# Patient Record
Sex: Female | Born: 1985 | Race: Black or African American | Hispanic: No | Marital: Single | Smoking: Current every day smoker
Health system: Southern US, Community
[De-identification: ages and names within clinical notes are randomized; demographics above are authoritative.]

---
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
## 2003-02-21 ENCOUNTER — Inpatient Hospital Stay (HOSPITAL_COMMUNITY): Admission: AD | Admit: 2003-02-21 | Discharge: 2003-02-21 | Payer: Self-pay | Admitting: Obstetrics & Gynecology

## 2003-02-23 ENCOUNTER — Inpatient Hospital Stay (HOSPITAL_COMMUNITY): Admission: AD | Admit: 2003-02-23 | Discharge: 2003-02-23 | Payer: Self-pay | Admitting: *Deleted

## 2004-06-03 ENCOUNTER — Inpatient Hospital Stay (HOSPITAL_COMMUNITY): Admission: AD | Admit: 2004-06-03 | Discharge: 2004-06-03 | Payer: Self-pay | Admitting: *Deleted

## 2004-06-12 ENCOUNTER — Other Ambulatory Visit: Admission: RE | Admit: 2004-06-12 | Discharge: 2004-06-12 | Payer: Self-pay | Admitting: Obstetrics and Gynecology

## 2004-08-04 ENCOUNTER — Inpatient Hospital Stay (HOSPITAL_COMMUNITY): Admission: AD | Admit: 2004-08-04 | Discharge: 2004-08-04 | Payer: Self-pay | Admitting: *Deleted

## 2004-11-06 ENCOUNTER — Inpatient Hospital Stay (HOSPITAL_COMMUNITY): Admission: AD | Admit: 2004-11-06 | Discharge: 2004-11-06 | Payer: Self-pay | Admitting: Obstetrics and Gynecology

## 2005-01-07 ENCOUNTER — Inpatient Hospital Stay (HOSPITAL_COMMUNITY): Admission: AD | Admit: 2005-01-07 | Discharge: 2005-01-09 | Payer: Self-pay | Admitting: Obstetrics and Gynecology

## 2005-02-17 ENCOUNTER — Other Ambulatory Visit: Admission: RE | Admit: 2005-02-17 | Discharge: 2005-02-17 | Payer: Self-pay | Admitting: Obstetrics and Gynecology

## 2006-08-17 ENCOUNTER — Emergency Department (HOSPITAL_COMMUNITY): Admission: EM | Admit: 2006-08-17 | Discharge: 2006-08-17 | Payer: Self-pay | Admitting: Emergency Medicine

## 2006-11-20 ENCOUNTER — Inpatient Hospital Stay (HOSPITAL_COMMUNITY): Admission: AD | Admit: 2006-11-20 | Discharge: 2006-11-20 | Payer: Self-pay | Admitting: Obstetrics & Gynecology

## 2006-11-21 ENCOUNTER — Emergency Department (HOSPITAL_COMMUNITY): Admission: EM | Admit: 2006-11-21 | Discharge: 2006-11-21 | Payer: Self-pay | Admitting: Emergency Medicine

## 2006-12-01 ENCOUNTER — Emergency Department (HOSPITAL_COMMUNITY): Admission: EM | Admit: 2006-12-01 | Discharge: 2006-12-01 | Payer: Self-pay | Admitting: Emergency Medicine

## 2007-06-29 ENCOUNTER — Emergency Department (HOSPITAL_COMMUNITY): Admission: EM | Admit: 2007-06-29 | Discharge: 2007-06-29 | Payer: Self-pay | Admitting: Emergency Medicine

## 2007-08-22 ENCOUNTER — Emergency Department (HOSPITAL_COMMUNITY): Admission: EM | Admit: 2007-08-22 | Discharge: 2007-08-22 | Payer: Self-pay | Admitting: Emergency Medicine

## 2007-11-02 ENCOUNTER — Emergency Department (HOSPITAL_COMMUNITY): Admission: EM | Admit: 2007-11-02 | Discharge: 2007-11-02 | Payer: Self-pay | Admitting: Emergency Medicine

## 2008-08-13 ENCOUNTER — Emergency Department (HOSPITAL_COMMUNITY): Admission: EM | Admit: 2008-08-13 | Discharge: 2008-08-13 | Payer: Self-pay | Admitting: Family Medicine

## 2010-03-25 ENCOUNTER — Inpatient Hospital Stay (INDEPENDENT_AMBULATORY_CARE_PROVIDER_SITE_OTHER)
Admission: RE | Admit: 2010-03-25 | Discharge: 2010-03-25 | Disposition: A | Discharge status: Home / Self Care | Payer: Self-pay | Source: Ambulatory Visit | Attending: Emergency Medicine | Admitting: Emergency Medicine

## 2010-03-25 DIAGNOSIS — H109 Unspecified conjunctivitis: Secondary | ICD-10-CM

## 2010-05-06 ENCOUNTER — Ambulatory Visit: Payer: Self-pay | Admitting: Obstetrics and Gynecology

## 2010-06-12 NOTE — Discharge Summary (Signed)
[facility]  Patient:    Patty Marshall, Patty Marshall                         MRN: [id number] Adm. Date:  03/23/00 Disc. Date: 03/26/00 Attending:  Bing Neighbors. Clearance Coots, M.D.                           Discharge Summary  ADMITTING DIAGNOSES:          Acute cholelithiasis.  DISCHARGE DIAGNOSES:          Acute cholelithiasis, status post laparoscopic cholecystectomy and intraoperative cholangiogram, improved, discharged home in good condition.  REASON FOR ADMISSION:         A 25 year old G0 with last menstrual period March 07, 2000 presented to triage after an episode of acute right upper quadrant pain yesterday after eating a [employer] meal.  The right upper quadrant pain followed by persistent pain and nausea and vomiting.  The patient could not keep anything down.  This is the first episode of such pain and nausea and vomiting.  PAST MEDICAL HISTORY:         See chart.  SOCIAL HISTORY:               See chart.  PHYSICAL EXAMINATION  GENERAL:                      Well-nourished, well-developed female in no acute distress.  VITAL SIGNS:                  Temperature 98.6, pulse 85, respiratory rate 21, blood pressure 135/81.  HEENT:                        Benign.  ABDOMEN:                      Soft, positive bowel sounds, tender right upper quadrant at costovertebral angle.  No masses or organomegaly appreciated.  PELVIC:                       Omitted.  LABORATORIES:                 SGOT 1306, SGPT 1461, total bilirubin 2.8, alkaline phosphatase 159.  Ultrasound revealed two clusters of gallstones within the gallbladder.  Fundus and body:  There was no gallbladder wall thickening, free fluid, or Murphys. Common bile duct was approximately 2.1 mm.  HOSPITAL COURSE:              Patient continued to have significant pain and was taken to the operating room on March 25, 2000 and underwent laparoscopic cholecystectomy and intraoperative cholangiogram  without any intraoperative complications.  Postoperative course was uncomplicated and she was discharged home on postoperative day #1 in good condition.  DISCHARGE MEDICATIONS:        1. Vicodin one to two tablets q.4h. p.r.n. for                                  pain.                               2. Motrin or Tylenol p.r.n. for pain.  DISCHARGE INSTRUCTIONS:  Routine written instructions were given for activity, diet, and wound care.  Patient is to follow-up with Dr. Lurene Shadow in the office in two weeks. DD:  08/19/00 TD:  08/20/00 Job: [id number]

## 2010-06-12 NOTE — Op Note (Signed)
[facility]  Patient:    Patty Marshall, Patty Marshall                         MRN: [id number] Proc. Date: 03/25/00 Adm. Date:  [id number] Disc. Date: [id number] Attending:  Tammi Sou                           Operative Report  PREOPERATIVE DIAGNOSIS:       Acute cholecystitis.  POSTOPERATIVE DIAGNOSIS:      Acute cholecystitis.  PROCEDURE:                    Laparoscopic cholecystectomy.                               Intraoperative cholangiogram.  SURGEON:                      Luisa Hart L. Lurene Shadow, M.D.  ASSISTANT:                    Marnee Spring. Wiliam Ke, M.D.  ANESTHESIA:                   General.  INDICATIONS:                  This patient is a 25 year old young woman presenting with right side abdominal pain following food associated with nausea and vomiting. In the hospital with elevated liver function studies and showing a bilirubin of up to 2.8 and transaminases greater than 1000 with a modestly elevated alkaline phosphatase. She was brought to the hospital and put on IVs and n.p.o. and observed over 24 hours. During that time, her bilirubin returned toward normal and transaminases came down significantly. She continued, however, to have some right upper quadrant pain and is brought to the operating room now for laparoscopic cholecystectomy.  DESCRIPTION OF PROCEDURE:     Following the induction of anesthesia, the patient was positioned supinely and the abdomen is routinely prepped and draped to be included in the sterile operative field. Open laparoscopy is created through the umbilicus with insertion of a Hasson cannula and insufflation of peritoneal cavity to 14 mmHg pressure using carbon dioxide. The camera was inserted and visual exploration of the abdomen carried out. The liver edge was sharp. The gallbladder was acutely inflamed. Anterior gastric wall and duodenum appeared to be normal. There was some tattering of the duodenum up to the  gallbladder. Under direct vision, epigastric and lateral ports are placed. The gallbladder is grasped and retracted cephalad. The gallbladder is noted to be somewhat intrahepatic. Dissection carried down near the ampulla with isolation of the cystic artery and cystic duct; the cystic artery being traced through the antrum to the gallbladder wall and the cystic duct being traced to the cystic duct-gallbladder junction. The common duct could be clearly seen and traced out for approximately _____  distal course and the hepatic duct could also be seen. The cystic artery was doubly clipped and transected. Cystic duct was clipped proximally and opened. A Reddick catheter passed into the abdomen through a 14-gauge Angiocath was then used to inject one-half strength Hypaque dye into the biliary system. The resultant cholangiogram showed free flow of contrast into the duodenum without any evidence of filling defects.  The upper hepatic radicles appeared to be normal.  The cholangiocatheter was removed and the cystic duct doubly clipped and transected and the gallbladder then dissected through from the liver bed with electrocautery. At the end of the dissection, the gut/liver bed was again thoroughly inspected for hemostasis with additional bleeding ______ with electrocautery. The right upper quadrant was then thoroughly irrigated with normal saline and the camera removed through the epigastric port. The gallbladder retrieved through the umbilical port without difficulty. The sponge, instrument, and sharp counts were verified and pneumoperitoneum deflated after the trocars were removed under direct vision. Wounds were then closed in layers as follows: Umbilical wound in two layers with 0 Dexon and 4-0 Dexon; epigastric and lateral flank wounds closed with 4-0 Dexon sutures. All wounds reinforced with Steri-Strips and sterile dressing is applied. Anesthetic reversed and patient removed from the  operating room to the recovery room in stable condition having tolerated the procedure well. DD:  03/25/00 TD:  03/27/00 Job: [id number]

## 2010-10-09 ENCOUNTER — Emergency Department (HOSPITAL_COMMUNITY)
Admission: EM | Admit: 2010-10-09 | Discharge: 2010-10-09 | Disposition: A | Discharge status: Home / Self Care | Payer: Self-pay | Attending: Emergency Medicine | Admitting: Emergency Medicine

## 2010-10-09 ENCOUNTER — Emergency Department (HOSPITAL_COMMUNITY): Payer: Self-pay

## 2010-10-09 DIAGNOSIS — J069 Acute upper respiratory infection, unspecified: Secondary | ICD-10-CM | POA: Insufficient documentation

## 2010-10-09 DIAGNOSIS — R062 Wheezing: Secondary | ICD-10-CM | POA: Insufficient documentation

## 2010-10-09 DIAGNOSIS — R1110 Vomiting, unspecified: Secondary | ICD-10-CM | POA: Insufficient documentation

## 2010-10-09 DIAGNOSIS — R059 Cough, unspecified: Secondary | ICD-10-CM | POA: Insufficient documentation

## 2010-10-09 DIAGNOSIS — R05 Cough: Secondary | ICD-10-CM | POA: Insufficient documentation

## 2010-10-09 DIAGNOSIS — R0609 Other forms of dyspnea: Secondary | ICD-10-CM | POA: Insufficient documentation

## 2010-10-09 DIAGNOSIS — R630 Anorexia: Secondary | ICD-10-CM | POA: Insufficient documentation

## 2010-10-09 DIAGNOSIS — J3489 Other specified disorders of nose and nasal sinuses: Secondary | ICD-10-CM | POA: Insufficient documentation

## 2010-10-09 DIAGNOSIS — F17200 Nicotine dependence, unspecified, uncomplicated: Secondary | ICD-10-CM | POA: Insufficient documentation

## 2010-10-09 DIAGNOSIS — R070 Pain in throat: Secondary | ICD-10-CM | POA: Insufficient documentation

## 2010-10-09 DIAGNOSIS — R0989 Other specified symptoms and signs involving the circulatory and respiratory systems: Secondary | ICD-10-CM | POA: Insufficient documentation

## 2010-10-09 DIAGNOSIS — R0602 Shortness of breath: Secondary | ICD-10-CM | POA: Insufficient documentation

## 2010-10-09 DIAGNOSIS — R079 Chest pain, unspecified: Secondary | ICD-10-CM | POA: Insufficient documentation

## 2010-11-04 LAB — URINALYSIS, ROUTINE W REFLEX MICROSCOPIC
Bilirubin Urine: NEGATIVE
Glucose, UA: NEGATIVE
Hgb urine dipstick: NEGATIVE
Ketones, ur: 15 — AB
Nitrite: NEGATIVE
Nitrite: POSITIVE — AB
Urobilinogen, UA: 0.2
Urobilinogen, UA: 4 — ABNORMAL HIGH

## 2010-11-04 LAB — DIFFERENTIAL
Basophils Absolute: 0
Basophils Relative: 0
Eosinophils Absolute: 0
Eosinophils Relative: 1
Lymphocytes Relative: 15
Monocytes Relative: 4

## 2010-11-04 LAB — POCT PREGNANCY, URINE
Operator id: 117411
Preg Test, Ur: NEGATIVE

## 2010-11-04 LAB — URINE CULTURE: Colony Count: 3000

## 2010-11-04 LAB — URINE MICROSCOPIC-ADD ON

## 2010-11-04 LAB — CBC
HCT: 39.1
MCHC: 35.8
MCV: 88.8
RBC: 4.41

## 2010-11-09 LAB — URINALYSIS, ROUTINE W REFLEX MICROSCOPIC
Ketones, ur: 15 — AB
Specific Gravity, Urine: 1.03
Urobilinogen, UA: 1
pH: 6

## 2010-11-09 LAB — GC/CHLAMYDIA PROBE AMP, GENITAL
Chlamydia, DNA Probe: POSITIVE — AB
GC Probe Amp, Genital: NEGATIVE

## 2010-11-09 LAB — WET PREP, GENITAL
Trich, Wet Prep: NONE SEEN
Yeast Wet Prep HPF POC: NONE SEEN

## 2010-11-09 LAB — URINE MICROSCOPIC-ADD ON

## 2010-11-09 LAB — PREGNANCY, URINE: Preg Test, Ur: NEGATIVE

## 2010-11-10 ENCOUNTER — Inpatient Hospital Stay (INDEPENDENT_AMBULATORY_CARE_PROVIDER_SITE_OTHER)
Admission: RE | Admit: 2010-11-10 | Discharge: 2010-11-10 | Disposition: A | Discharge status: Home / Self Care | Payer: Self-pay | Source: Ambulatory Visit | Attending: Family Medicine | Admitting: Family Medicine

## 2010-11-10 DIAGNOSIS — L600 Ingrowing nail: Secondary | ICD-10-CM

## 2016-10-15 ENCOUNTER — Encounter (HOSPITAL_COMMUNITY): Payer: Self-pay | Admitting: Emergency Medicine

## 2016-10-15 ENCOUNTER — Emergency Department (HOSPITAL_COMMUNITY)
Admission: EM | Admit: 2016-10-15 | Discharge: 2016-10-15 | Disposition: A | Discharge status: Home / Self Care | Payer: Medicaid - Out of State | Attending: Emergency Medicine | Admitting: Emergency Medicine

## 2016-10-15 DIAGNOSIS — R531 Weakness: Secondary | ICD-10-CM | POA: Diagnosis present

## 2016-10-15 DIAGNOSIS — E876 Hypokalemia: Secondary | ICD-10-CM | POA: Insufficient documentation

## 2016-10-15 DIAGNOSIS — F17200 Nicotine dependence, unspecified, uncomplicated: Secondary | ICD-10-CM | POA: Insufficient documentation

## 2016-10-15 LAB — COMPREHENSIVE METABOLIC PANEL
ALT: 27 U/L (ref 14–54)
AST: 32 U/L (ref 15–41)
Albumin: 3.9 g/dL (ref 3.5–5.0)
Alkaline Phosphatase: 79 U/L (ref 38–126)
Anion gap: 9 (ref 5–15)
BILIRUBIN TOTAL: 0.3 mg/dL (ref 0.3–1.2)
BUN: 15 mg/dL (ref 6–20)
CHLORIDE: 104 mmol/L (ref 101–111)
CO2: 24 mmol/L (ref 22–32)
CREATININE: 0.95 mg/dL (ref 0.44–1.00)
Calcium: 9.6 mg/dL (ref 8.9–10.3)
GFR calc Af Amer: >60 mL/min (ref >60)
Glucose, Bld: 104 mg/dL — ABNORMAL HIGH (ref 65–99)
Potassium: 3.1 mmol/L — ABNORMAL LOW (ref 3.5–5.1)
Sodium: 137 mmol/L (ref 135–145)
TOTAL PROTEIN: 7.1 g/dL (ref 6.5–8.1)

## 2016-10-15 LAB — POC URINE PREG, ED: PREG TEST UR: NEGATIVE

## 2016-10-15 LAB — CBC WITH DIFFERENTIAL/PLATELET
Basophils Absolute: 0 10*3/uL (ref 0.0–0.1)
Basophils Relative: 0 %
EOS PCT: 1 %
Eosinophils Absolute: 0.1 10*3/uL (ref 0.0–0.7)
HCT: 39.5 % (ref 36.0–46.0)
Hemoglobin: 13.5 g/dL (ref 12.0–15.0)
LYMPHS ABS: 4.7 10*3/uL — AB (ref 0.7–4.0)
Lymphocytes Relative: 58 %
MCH: 31.1 pg (ref 26.0–34.0)
MCHC: 34.2 g/dL (ref 30.0–36.0)
MCV: 91 fL (ref 78.0–100.0)
MONOS PCT: 6 %
Monocytes Absolute: 0.5 10*3/uL (ref 0.1–1.0)
NEUTROS ABS: 2.8 10*3/uL (ref 1.7–7.7)
NEUTROS PCT: 35 %
PLATELETS: 398 10*3/uL (ref 150–400)
RBC: 4.34 MIL/uL (ref 3.87–5.11)
RDW: 12.8 % (ref 11.5–15.5)
WBC: 8.1 10*3/uL (ref 4.0–10.5)

## 2016-10-15 LAB — URINALYSIS, ROUTINE W REFLEX MICROSCOPIC
Glucose, UA: NEGATIVE mg/dL
Hgb urine dipstick: NEGATIVE
KETONES UR: 5 mg/dL — AB
Leukocytes, UA: NEGATIVE
Nitrite: NEGATIVE
Protein, ur: 100 mg/dL — AB
SPECIFIC GRAVITY, URINE: 1.033 — AB (ref 1.005–1.030)
pH: 5 (ref 5.0–8.0)

## 2016-10-15 LAB — I-STAT TROPONIN, ED: TROPONIN I, POC: 0 ng/mL (ref 0.00–0.08)

## 2016-10-15 MED ORDER — POTASSIUM CHLORIDE CRYS ER 20 MEQ PO TBCR
60.0000 meq | EXTENDED_RELEASE_TABLET | Freq: Once | ORAL | Status: AC
  Administered 2016-10-15: 60 meq via ORAL
  Filled 2016-10-15: qty 3

## 2016-10-15 NOTE — ED Triage Notes (Signed)
Pt c/o numbness to bilateral hands and generalized weakness starting 20 minutes PTA. Dr. Madilyn Hook aware, new orders placed.

## 2016-10-15 NOTE — ED Notes (Signed)
ED Provider at bedside. 

## 2016-10-15 NOTE — ED Provider Notes (Signed)
[facility]-EMERGENCY DEPT Provider Note   CSN: [id number] Arrival date & time: 10/15/16  1745     History   Chief Complaint Chief Complaint  Patient presents with  . Numbness    HPI Patty Marshall is a 31 y.o. female.  31 y/o female with a hx of HTN presents to the ED for generalized weakness. Patient states that she felt generally weak at approximately 1700 today. This was associated with palpitations characterized as "heart fluttering". She also felt paresthesias in the tips of her bilateral fingers and toes. Patient further notes some shortness of breath stating, "it felt as though something was preventing me from breathing". She denies increased stress or lack of sleep. No recent medication changes. She has been compliant with her daily antihypertensives. No recent fevers or sick contacts. Patient denies vomiting, chest pain, recent surgeries or hospitalizations, leg swelling. She denies a history of similar symptoms as well as a history of anxiety. Symptoms spontaneously improved approximately one hour after onset.   The history is provided by the patient. No language interpreter was used.    History reviewed. No pertinent past medical history.  There are no active problems to display for this patient.   History reviewed. No pertinent surgical history.  OB History    No data available       Home Medications    Prior to Admission medications   Not on File    Family History No family history on file.  Social History Social History  Substance Use Topics  . Smoking status: Current Every Day Smoker  . Smokeless tobacco: Never Used  . Alcohol use Yes     Allergies   Patient has no known allergies.   Review of Systems Review of Systems Ten systems reviewed and are negative for acute change, except as noted in the HPI.    Physical Exam Updated Vital Signs BP 107/70   Pulse 84   Temp 98.4 F (36.9 C) (Oral)   Resp 15   Ht  (1.6 m)   Wt 96.2 kg (212  lb)   LMP 10/09/2016   SpO2 100%   BMI 37.55 kg/m   Physical Exam  Constitutional: She is oriented to person, place, and time. She appears well-developed and well-nourished. No distress.  Nontoxic appearing and in NAD  HENT:  Head: Normocephalic and atraumatic.  Eyes: Conjunctivae and EOM are normal. No scleral icterus.  Neck: Normal range of motion.  Cardiovascular: Normal rate, regular rhythm and intact distal pulses.   Pulmonary/Chest: Effort normal. No respiratory distress. She has no wheezes. She has no rales.  Respirations even and unlabored  Musculoskeletal: Normal range of motion.  Neurological: She is alert and oriented to person, place, and time. She exhibits normal muscle tone. Coordination normal.  GCS 15. Patient moving all extremities.  Skin: Skin is warm and dry. No rash noted. She is not diaphoretic. No erythema. No pallor.  Psychiatric: She has a normal mood and affect. Her behavior is normal.  Nursing note and vitals reviewed.    ED Treatments / Results  Labs (all labs ordered are listed, but only abnormal results are displayed) Labs Reviewed  COMPREHENSIVE METABOLIC PANEL - Abnormal; Notable for the following:       Result Value   Potassium 3.1 (*)    Glucose, Bld 104 (*)    All other components within normal limits  CBC WITH DIFFERENTIAL/PLATELET - Abnormal; Notable for the following:    Lymphs Abs 4.7 (*)  All other components within normal limits  URINALYSIS, ROUTINE W REFLEX MICROSCOPIC - Abnormal; Notable for the following:    Color, Urine AMBER (*)    APPearance HAZY (*)    Specific Gravity, Urine 1.033 (*)    Bilirubin Urine SMALL (*)    Ketones, ur 5 (*)    Protein, ur 100 (*)    Bacteria, UA RARE (*)    Squamous Epithelial / LPF 0-5 (*)    All other components within normal limits  POC URINE PREG, ED  I-STAT TROPONIN, ED    EKG  EKG Interpretation None       Radiology No results found.  Procedures Procedures (including  critical care time)  Medications Ordered in ED Medications  potassium chloride SA (K-DUR,KLOR-CON) CR tablet 60 mEq (60 mEq Oral Given 10/15/16 2241)     Initial Impression / Assessment and Plan / ED Course  I have reviewed the triage vital signs and the nursing notes.  Pertinent labs & imaging results that were available during my care of the patient were reviewed by me and considered in my medical decision making (see chart for details).     31 year old female presents to the emergency department for a sensation of generalized weakness with associated palpitations and shortness of breath. This is followed by onset of paresthesias in the tips of her bilateral fingers and toes. Symptoms spontaneously resolved within 1 hour of onset. She denies any symptoms currently. They sound more consistent with a panic attack, though patient denies a history of anxiety. Arrhythmia also considered; however, patient with a reassuring EKG and normal troponin. Doubt PE and patient is PERC negative. Remainder of workup is also reassuring.  Low suspicion for emergent cause of symptoms. Plan to refer to primary care for additional outpatient follow-up. Return precautions discussed and provided. Patient discharged in stable condition with no unaddressed concerns.   Final Clinical Impressions(s) / ED Diagnoses   Final diagnoses:  Hypokalemia    New Prescriptions There are no discharge medications for this patient.    Antony Madura, PA-C 10/15/16 Arletha Grippe    Shaune Pollack, MD 10/16/16 [phone]

## 2016-12-04 ENCOUNTER — Emergency Department (HOSPITAL_COMMUNITY): Payer: Self-pay

## 2016-12-04 ENCOUNTER — Emergency Department (HOSPITAL_COMMUNITY)
Admission: EM | Admit: 2016-12-04 | Discharge: 2016-12-04 | Disposition: A | Discharge status: Home / Self Care | Payer: Self-pay | Attending: Emergency Medicine | Admitting: Emergency Medicine

## 2016-12-04 ENCOUNTER — Encounter (HOSPITAL_COMMUNITY): Payer: Self-pay | Admitting: Emergency Medicine

## 2016-12-04 ENCOUNTER — Other Ambulatory Visit: Payer: Self-pay

## 2016-12-04 DIAGNOSIS — F17200 Nicotine dependence, unspecified, uncomplicated: Secondary | ICD-10-CM | POA: Insufficient documentation

## 2016-12-04 DIAGNOSIS — R079 Chest pain, unspecified: Secondary | ICD-10-CM | POA: Insufficient documentation

## 2016-12-04 DIAGNOSIS — Z79899 Other long term (current) drug therapy: Secondary | ICD-10-CM | POA: Insufficient documentation

## 2016-12-04 LAB — BASIC METABOLIC PANEL
Anion gap: 8 (ref 5–15)
BUN: 11 mg/dL (ref 6–20)
CO2: 24 mmol/L (ref 22–32)
CREATININE: 0.81 mg/dL (ref 0.44–1.00)
Calcium: 9.5 mg/dL (ref 8.9–10.3)
Chloride: 104 mmol/L (ref 101–111)
GFR calc Af Amer: >60 mL/min (ref >60)
GFR calc non Af Amer: >60 mL/min (ref >60)
Glucose, Bld: 84 mg/dL (ref 65–99)
Potassium: 3.4 mmol/L — ABNORMAL LOW (ref 3.5–5.1)
Sodium: 136 mmol/L (ref 135–145)

## 2016-12-04 LAB — I-STAT BETA HCG BLOOD, ED (MC, WL, AP ONLY): I-stat hCG, quantitative: <5 m[IU]/mL (ref <5)

## 2016-12-04 LAB — CBC
HCT: 40.3 % (ref 36.0–46.0)
Hemoglobin: 13.5 g/dL (ref 12.0–15.0)
MCH: 30.6 pg (ref 26.0–34.0)
MCHC: 33.5 g/dL (ref 30.0–36.0)
MCV: 91.4 fL (ref 78.0–100.0)
PLATELETS: 358 10*3/uL (ref 150–400)
RBC: 4.41 MIL/uL (ref 3.87–5.11)
RDW: 13.4 % (ref 11.5–15.5)
WBC: 5.9 10*3/uL (ref 4.0–10.5)

## 2016-12-04 LAB — I-STAT TROPONIN, ED
Troponin i, poc: 0 ng/mL (ref 0.00–0.08)
Troponin i, poc: 0 ng/mL (ref 0.00–0.08)

## 2016-12-04 MED ORDER — ACETAMINOPHEN 500 MG PO TABS
1000.0000 mg | ORAL_TABLET | Freq: Once | ORAL | Status: AC
  Administered 2016-12-04: 1000 mg via ORAL
  Filled 2016-12-04: qty 2

## 2016-12-04 MED ORDER — PANTOPRAZOLE SODIUM 40 MG IV SOLR
40.0000 mg | Freq: Once | INTRAVENOUS | Status: AC
  Administered 2016-12-04: 40 mg via INTRAVENOUS
  Filled 2016-12-04: qty 40

## 2016-12-04 MED ORDER — GI COCKTAIL ~~LOC~~
30.0000 mL | Freq: Once | ORAL | Status: AC
  Administered 2016-12-04: 30 mL via ORAL
  Filled 2016-12-04: qty 30

## 2016-12-04 MED ORDER — FAMOTIDINE 20 MG PO TABS: 20.0000 mg | ORAL_TABLET | Freq: Two times a day (BID) | ORAL | 0 refills | Status: AC

## 2016-12-04 NOTE — ED Triage Notes (Signed)
Pt states new onset centralized chest pain radiating into neck and back. Worse with deep breaths. Pt states hx of HTN. Denies recent travel/not on birth control. Pt states she has not taken her BP meds yet today and is out of one of them.

## 2016-12-04 NOTE — Discharge Instructions (Signed)
Take Pepcid twice daily as prescribed.  Please follow-up and establish care with a primary care provider by calling one of the numbers below.  Try to eat at least something, especially if you will be drinking acidic drinks like coffee.  Please return the emergency department if you develop any new or worsening symptoms.

## 2016-12-04 NOTE — ED Provider Notes (Signed)
[facility]   CSN: [id number] Arrival date & time: 12/04/16  1130     History   Chief Complaint Chief Complaint  Patient presents with  . Chest Pain    HPI Patty Marshall is a 31 y.o. female with history of hypertension which is controlled with medication who presents with a 3-hour history of central chest pain.  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  These pains resolved and she began having a discomfort and "irritation"in her central chest.  Nothing makes it better or worse.  She reports she feels like she has to take a deep breath to make it better.  She is not short of breath.  She denies pleuritic symptoms.  She reports she did not eat anything this morning.  She did have some coffee and apple juice.  She denies any abdominal pain, nausea, vomiting, urinary symptoms.  She denies occasion prior to arrival.  She denies any recent long trips, surgeries, cancer, new leg pain or swelling, exogenous estrogen use, or history of blood clots.  Patient admits to smoking cigarettes and marijuana, however did not smoke any marijuana today.  She denies cocaine use.  HPI  History reviewed. No pertinent past medical history.  There are no active problems to display for this patient.   No past surgical history on file.  OB History    No data available       Home Medications    Prior to Admission medications   Medication Sig Start Date End Date Taking? Authorizing Provider  amLODipine (NORVASC) 5 MG tablet Take 5 mg daily by mouth.   Yes [provider]  enalapril-hydrochlorothiazide (VASERETIC) 10-25 MG tablet Take 1 tablet daily by mouth.   Yes [provider]  famotidine (PEPCID) 20 MG tablet Take 1 tablet (20 mg total) 2 (two) times daily by mouth. 12/04/16   Leontyne Manville, Waylan BogaAlexandra M, PA-C    Family History No family history on  file.  Social History Social History   Tobacco Use  . Smoking status: Current Every Day Smoker  . Smokeless tobacco: Never Used  Substance Use Topics  . Alcohol use: Yes  . Drug use: Yes    Types: Marijuana     Allergies   Patient has no known allergies.   Review of Systems Review of Systems  Constitutional: Negative for chills and fever.  HENT: Negative for facial swelling and sore throat.   Respiratory: Negative for cough and shortness of breath.   Cardiovascular: Positive for chest pain.  Gastrointestinal: Negative for abdominal pain, nausea and vomiting.  Genitourinary: Negative for dysuria.  Musculoskeletal: Positive for back pain and neck pain.  Skin: Negative for rash and wound.  Neurological: Negative for headaches.  Psychiatric/Behavioral: The patient is not nervous/anxious.      Physical Exam Updated Vital Signs BP 120/87   Pulse 61   Temp 98.2 F (36.8 C) (Oral)   Resp 19   Ht 5\' 4"  (1.626 m)   Wt 89.8 kg (198 lb)   LMP 11/24/2016   SpO2 100%   BMI 33.99 kg/m   Physical Exam  Constitutional: She appears well-developed and well-nourished. No distress.  HENT:  Head: Normocephalic and atraumatic.  Mouth/Throat: Oropharynx is clear and moist. No oropharyngeal exudate.  Eyes: Conjunctivae are normal. Pupils are equal, round, and reactive to light. Right eye exhibits no discharge. Left eye  exhibits no discharge. No scleral icterus.  Neck: Normal range of motion. Neck supple. No thyromegaly present.  Cardiovascular: Normal rate, regular rhythm, normal heart sounds and intact distal pulses. Exam reveals no gallop and no friction rub.  No murmur heard. Pulmonary/Chest: Effort normal and breath sounds normal. No stridor. No respiratory distress. She has no wheezes. She has no rales. She exhibits no tenderness.  Abdominal: Soft. Bowel sounds are normal. She exhibits no distension. There is no tenderness. There is no rebound and no guarding.  Musculoskeletal:  She exhibits no edema.  No calf pain or tenderness  Lymphadenopathy:    She has no cervical adenopathy.  Neurological: She is alert. Coordination normal.  Skin: Skin is warm and dry. No rash noted. She is not diaphoretic. No pallor.  Psychiatric: She has a normal mood and affect.  Nursing note and vitals reviewed.    ED Treatments / Results  Labs (all labs ordered are listed, but only abnormal results are displayed) Labs Reviewed  BASIC METABOLIC PANEL - Abnormal; Notable for the following components:      Result Value   Potassium 3.4 (*)    All other components within normal limits  CBC  I-STAT TROPONIN, ED  I-STAT BETA HCG BLOOD, ED ([facility], [facility], [facility] ONLY)  I-STAT TROPONIN, ED    EKG  EKG Interpretation  Date/Time:  Saturday December 04 2016 11:35:48 EST Ventricular Rate:  68 PR Interval:  130 QRS Duration: 72 QT Interval:  390 QTC Calculation: 414 R Axis:   57 Text Interpretation:  Normal sinus rhythm Normal ECG No significant change since last tracing Confirmed by Gwyneth SproutPlunkett, Whitney ([id number]) on 12/04/2016 12:04:05 PM Also confirmed by Gwyneth SproutPlunkett, Whitney ([id number]), editor Madalyn RobEverhart, Marilyn [phone]([id number])  on 12/04/2016 12:55:21 PM       Radiology Dg Chest 2 View  Result Date: 12/04/2016 CLINICAL DATA:  Shoulder heaviness EXAM: CHEST  2 VIEW COMPARISON:  10/09/2010 FINDINGS: Normal heart size. Lungs clear. No pneumothorax. No pleural effusion. IMPRESSION: No active cardiopulmonary disease. Electronically Signed   By: Jolaine ClickArthur  Hoss M.D.   On: 12/04/2016 12:16    Procedures Procedures (including critical care time)  Medications Ordered in ED Medications  pantoprazole (PROTONIX) injection 40 mg (40 mg Intravenous Given 12/04/16 1241)  gi cocktail (Maalox,Lidocaine,Donnatal) (30 mLs Oral Given 12/04/16 1241)  acetaminophen (TYLENOL) tablet 1,000 mg (1,000 mg Oral Given 12/04/16 1342)     Initial Impression / Assessment and Plan / ED Course  I have reviewed the triage vital signs  and the nursing notes.  Pertinent labs & imaging results that were available during my care of the patient were reviewed by me and considered in my medical decision making (see chart for details).  Clinical Course as of Dec 04 1733  Sat Dec 04, 2016  1319 On reevaluation after Protonix and GI cocktail, patient states that she feels a little better, however her pain is less and irritation and more of a pain, however it is becoming intermittent and less constant like it was.  [AL]    Clinical Course User Index [AL] Emi HolesLaw, Oleda Borski M, PA-C    Patient with chest pain, most likely of gastrointestinal cause.  Patient improved after IV Protonix, GI cocktail, and Tylenol.  It also improved after eating.  Labs are unremarkable.  Delta troponin is negative. Doubt ACS, heart score 1 or 2. PERC negative. EKG shows NSR.  Chest x-ray is negative.  Will discharge home with Pepcid.  I encouraged patient to have at least a small  breakfast each morning to help with her symptoms.  Patient advised to follow-up and establish care with a primary care provider.  She was given resources.  Strict return precautions discussed.  Patient understands and agrees with plan.  Patient vitals stable throughout ED course and discharged in satisfactory condition. I discussed patient case with Dr. Anitra Lauth who guided the patient's management and agrees with plan.   Final Clinical Impressions(s) / ED Diagnoses   Final diagnoses:  Nonspecific chest pain    ED Discharge Orders        Ordered    famotidine (PEPCID) 20 MG tablet  2 times daily     12/04/16 [address] [place], [place] 12/04/16 1735    Gwyneth Sprout, MD 12/05/16 1149

## 2019-06-07 IMAGING — CR DG CHEST 2V
2 series · 2 of 2 positions shown · non-contrast
Comparison: 10/09/2010

CLINICAL DATA: Shoulder heaviness

EXAM:
CHEST  2 VIEW

[chest pa]
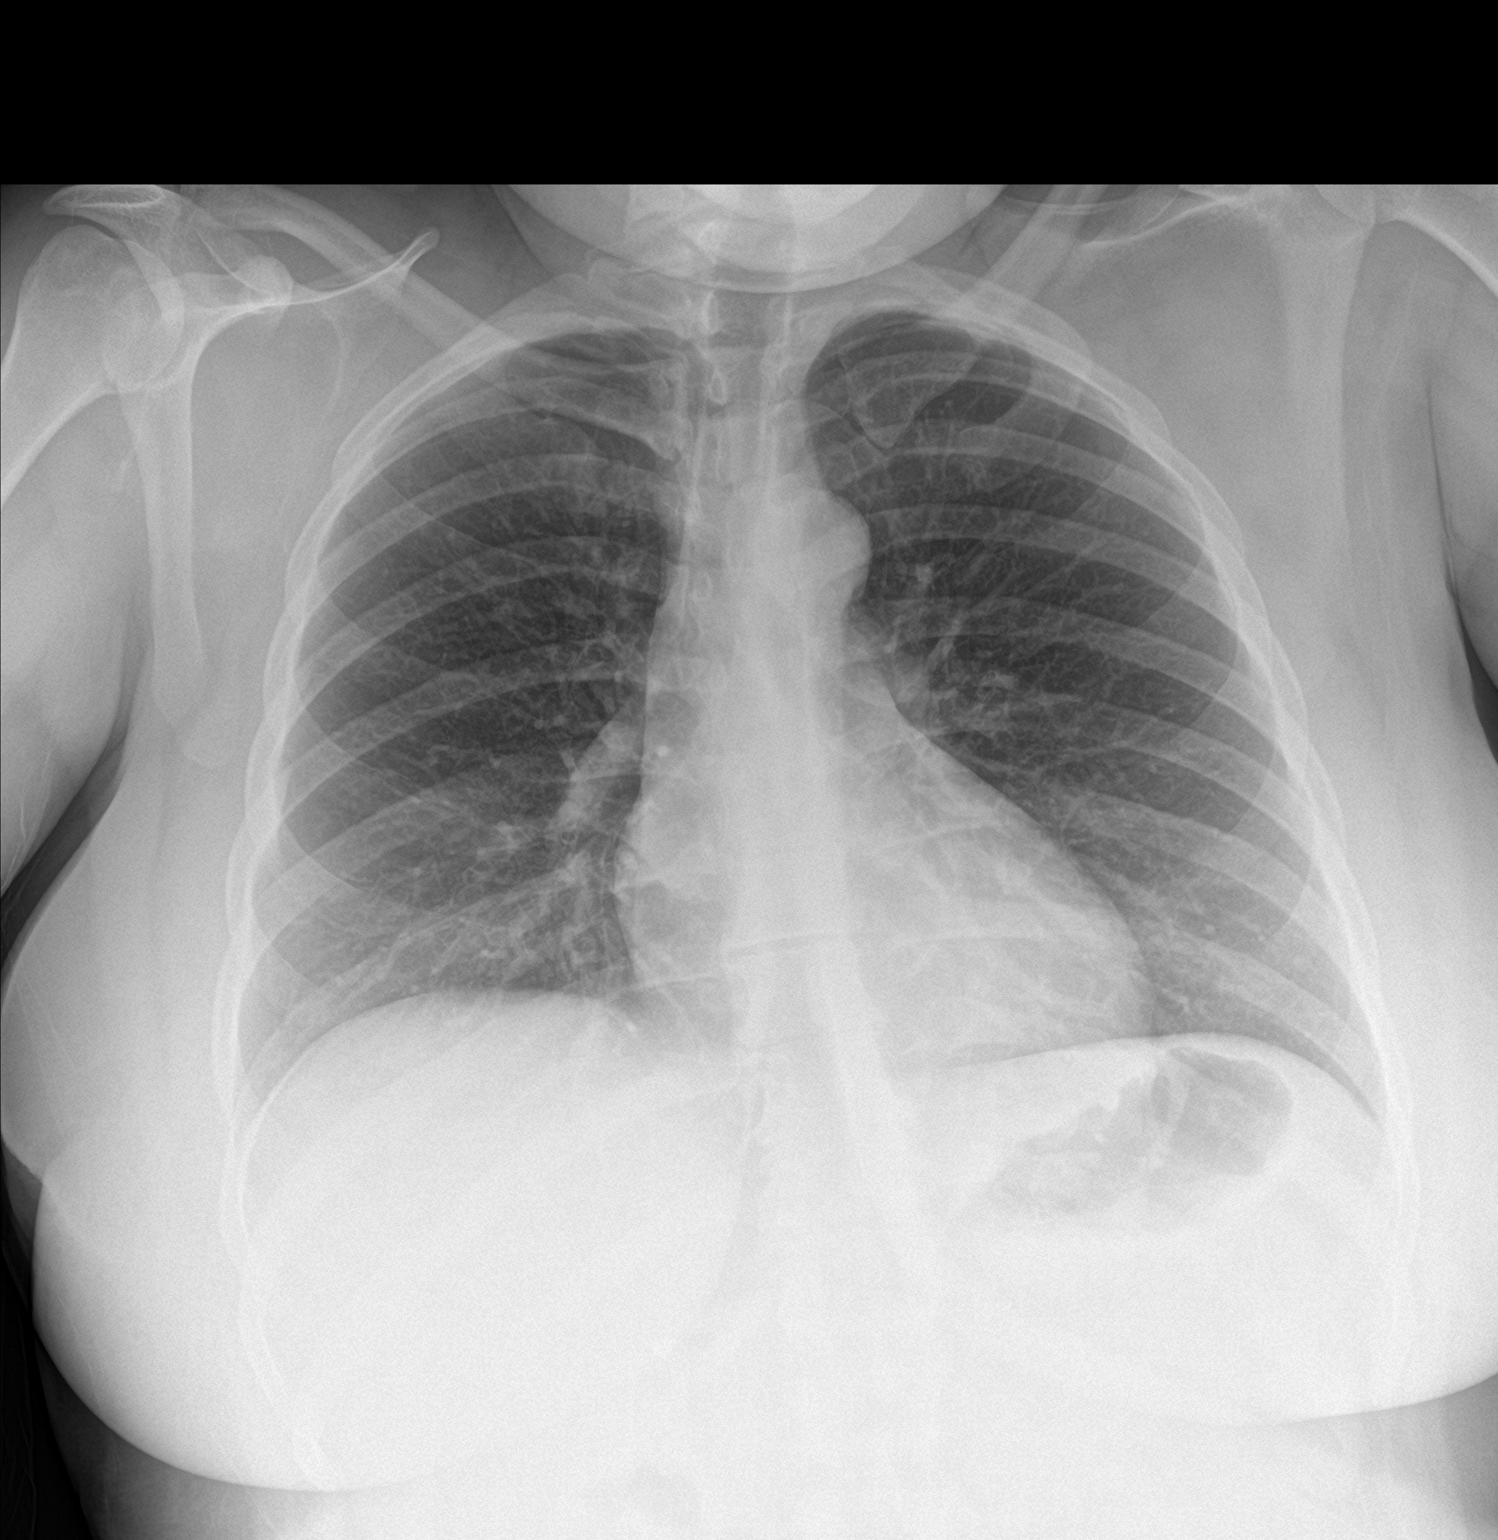

[chest lat]
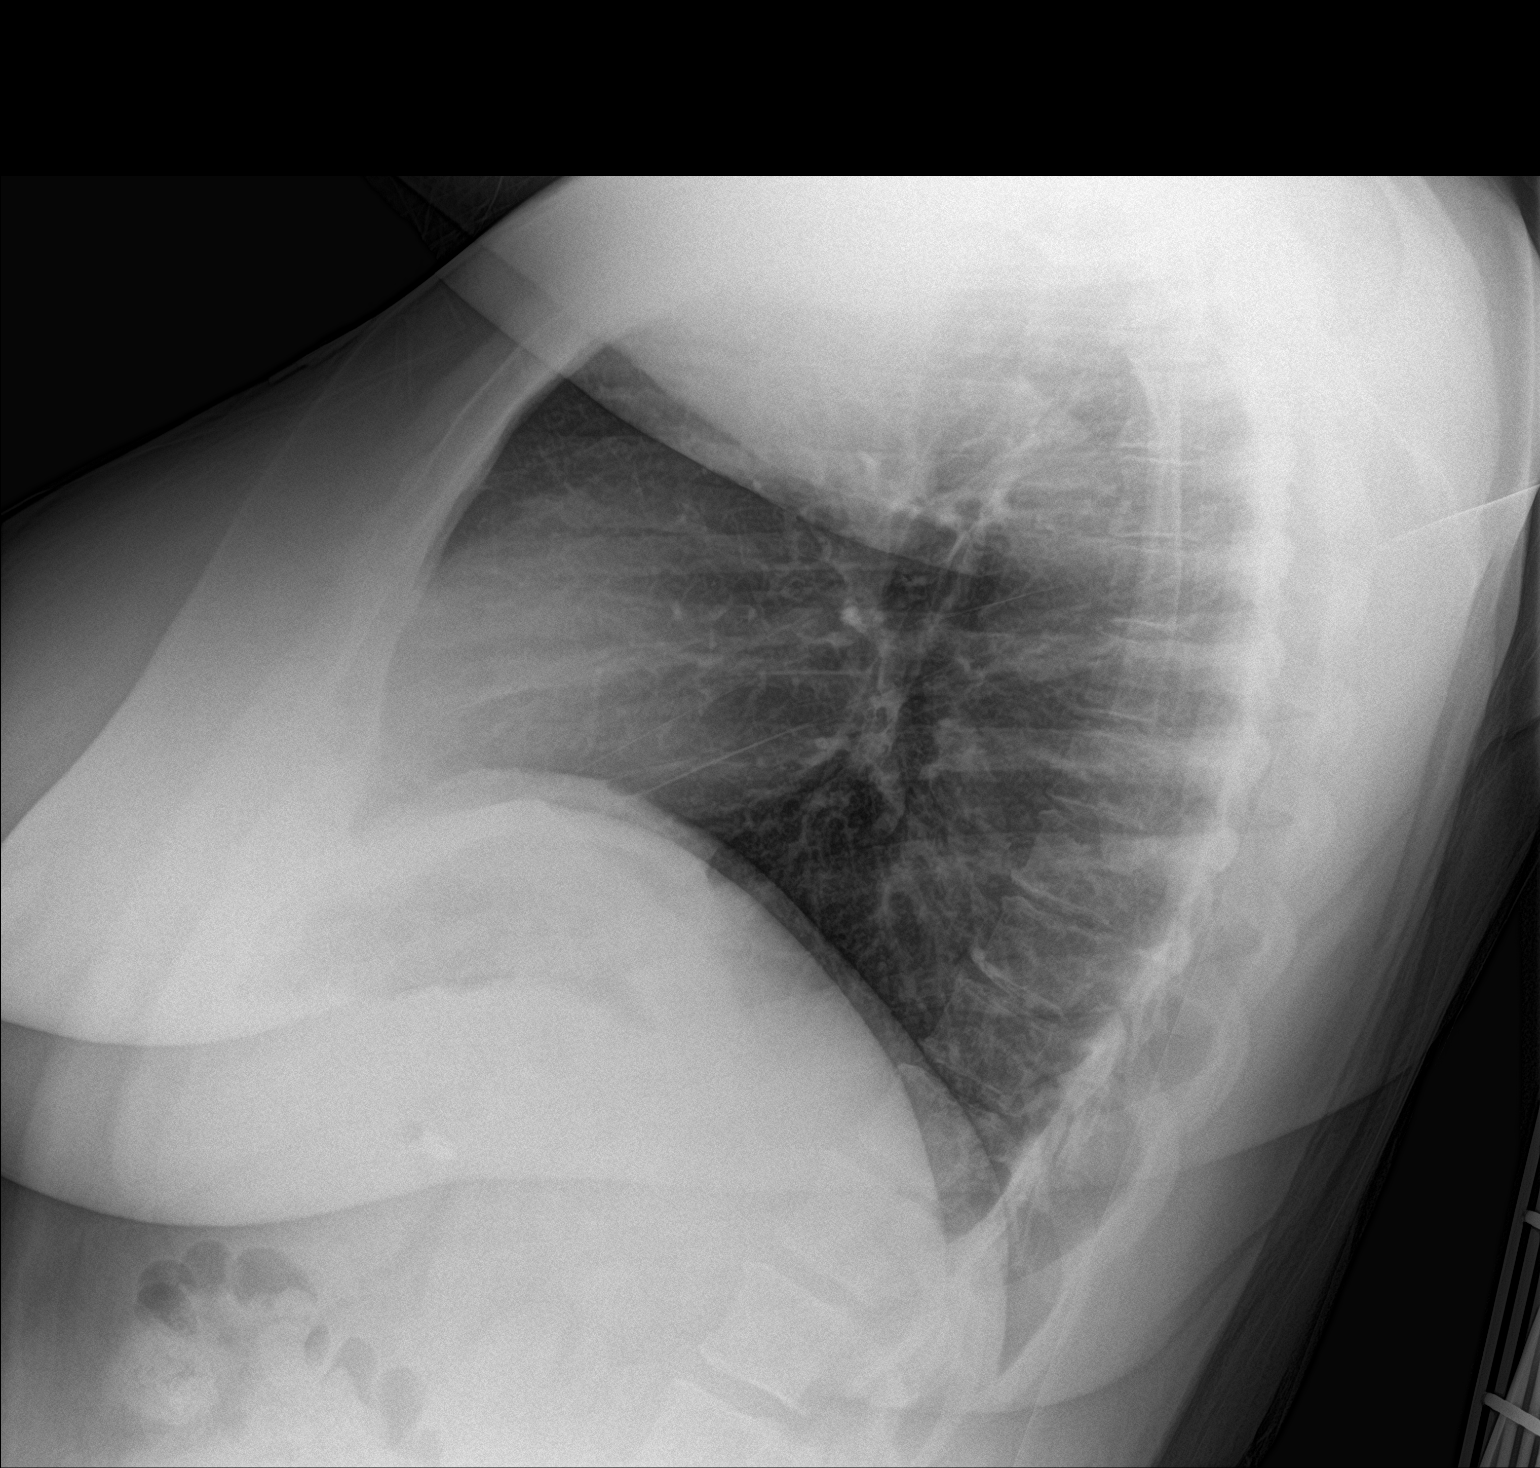

[2 of 2 positions shown; findings below may reference images not displayed]

FINDINGS: Normal heart size. Lungs clear. No pneumothorax. No pleural
effusion.
IMPRESSION: No active cardiopulmonary disease.
